# Patient Record
Sex: Female | Born: 2000 | Hispanic: Yes | Marital: Single | Smoking: Never smoker
Health system: Southern US, Community
[De-identification: ages and names within clinical notes are randomized; demographics above are authoritative.]

## PROBLEM LIST (undated history)

## (undated) DIAGNOSIS — N911 Secondary amenorrhea: Secondary | ICD-10-CM

## (undated) DIAGNOSIS — L309 Dermatitis, unspecified: Secondary | ICD-10-CM

## (undated) DIAGNOSIS — T783XXA Angioneurotic edema, initial encounter: Secondary | ICD-10-CM

## (undated) HISTORY — DX: Angioneurotic edema, initial encounter: T78.3XXA

## (undated) HISTORY — DX: Dermatitis, unspecified: L30.9

---
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
## 2021-03-25 ENCOUNTER — Other Ambulatory Visit: Payer: Self-pay

## 2021-03-25 ENCOUNTER — Encounter: Payer: Self-pay | Admitting: Allergy

## 2021-03-25 ENCOUNTER — Ambulatory Visit (INDEPENDENT_AMBULATORY_CARE_PROVIDER_SITE_OTHER): Payer: Medicaid Other | Admitting: Allergy

## 2021-03-25 VITALS — BP 118/76 | HR 82 | Temp 97.6°F | Resp 16 | Ht 62.0 in | Wt 169.6 lb

## 2021-03-25 DIAGNOSIS — T781XXD Other adverse food reactions, not elsewhere classified, subsequent encounter: Secondary | ICD-10-CM

## 2021-03-25 DIAGNOSIS — J302 Other seasonal allergic rhinitis: Secondary | ICD-10-CM | POA: Diagnosis not present

## 2021-03-25 DIAGNOSIS — H1010 Acute atopic conjunctivitis, unspecified eye: Secondary | ICD-10-CM | POA: Diagnosis not present

## 2021-03-25 DIAGNOSIS — J3089 Other allergic rhinitis: Secondary | ICD-10-CM

## 2021-03-25 MED ORDER — FLUTICASONE PROPIONATE 50 MCG/ACT NA SUSP: 1.0000 | Freq: Two times a day (BID) | NASAL | 5 refills | Status: AC | PRN

## 2021-03-25 MED ORDER — CROMOLYN SODIUM 4 % OP SOLN: 1.0000 [drp] | Freq: Four times a day (QID) | OPHTHALMIC | 5 refills | Status: DC | PRN

## 2021-03-25 NOTE — Assessment & Plan Note (Signed)
Past history - Rhinoconjunctivitis symptoms for 10 years mainly in the spring.   She also mentions getting an "allergy injection" every spring which has been helping. 2021 skin testing showed: Positive to grass, weed, ragweed, trees, dust mites, cat.  Interim history - symptoms flared. Not sure about logistics of getting AIT as she goes to school at [employer].   Continue environmental control measures as below.  May use over the counter antihistamines such as Zyrtec (cetirizine), Claritin (loratadine), Allegra (fexofenadine), or Xyzal (levocetirizine) daily as needed.   May use cromolyn 4% 1 drop in each eye up to four times a day as needed for itchy/watery eyes.   May use Flonase (fluticasone) nasal spray 1 spray per nostril twice a day as needed for nasal congestion.   Nasal saline spray (i.e., Simply Saline) or nasal saline lavage (i.e., NeilMed) is recommended as needed and prior to medicated nasal sprays.  Consider allergy injections for long term control if above medications do not help the symptoms - handout given.   You can check with your student health at [facility] if they administer allergy shots for their students.  If you want to start in the summer when are you are in [place] let us know.

## 2021-03-25 NOTE — Patient Instructions (Addendum)
Food:  Continue to avoid the foods that bother you - cherries, apples, kiwi.   START to avoid bananas for now.  For mild symptoms you can take over the counter antihistamines such as Benadryl and monitor symptoms closely. If symptoms worsen or if you have severe symptoms including breathing issues, throat closure, significant swelling, whole body hives, severe diarrhea and vomiting, lightheadedness then seek immediate medical care.  Get bloodwork:  We are ordering labs, so please allow 1-2 weeks for the results to come back. With the newly implemented Cures Act, the labs might be visible to you at the same time that they become visible to me. However, I will not address the results until all of the results are back, so please be patient.  In the meantime, continue recommendations in your patient instructions, including avoidance measures (if applicable), until you hear from me.   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 This is caused by cross reactivity of pollen with fresh fruits and vegetables, and nuts. Symptoms are usually localized in the form of itching and burning in mouth and throat. Very rarely it can progress to more severe symptoms. Eating foods in cooked or processed forms usually minimizes symptoms. I recommended avoidance of eating the problem foods, especially during the peak season(s). Sometimes, OFAS can induce severe throat swelling or even a systemic reaction; with such instance, I advised them to report to a local ER. A list of common pollens and food cross-reactivities was provided to the patient.   Environmental allergies  Continue environmental control measures as below.  May use over the counter antihistamines such as Zyrtec (cetirizine), Claritin (loratadine), Allegra (fexofenadine), or Xyzal (levocetirizine) daily as needed.   May use cromolyn 4% 1 drop in each eye up to four times a day as  needed for itchy/watery eyes.   May use Flonase (fluticasone) nasal spray 1 spray per nostril twice a day as needed for nasal congestion.   Nasal saline spray (i.e., Simply Saline) or nasal saline lavage (i.e., NeilMed) is recommended as needed and prior to medicated nasal sprays.  Consider allergy injections for long term control if above medications do not help the symptoms - handout given.   You can check with your student health at [facility] if they administer allergy shots for their students.  If you want to start in the summer when are you are in [place] let us know.   Follow up in 6 months or sooner if needed.   Reducing Pollen Exposure . Pollen seasons: trees (spring), grass (summer) and ragweed/weeds (fall). Marland Kitchen Keep windows closed in your home and car to lower pollen exposure.  Lilian Kapur air conditioning in the bedroom and throughout the house if possible.  . Avoid going out in dry windy days - especially early morning. . Pollen counts are highest between 5 - 10 AM and on dry, hot and windy days.  . Save outside activities for late afternoon or after a heavy rain, when pollen levels are lower.  . Avoid mowing of grass if you have grass pollen allergy. Marland Kitchen Be aware that pollen can also be transported indoors on people and pets.  . Dry your clothes in an automatic dryer rather than hanging them outside where they might collect pollen.  . Rinse hair and eyes before bedtime. Control of House Dust Mite Allergen . Dust mite allergens are a common trigger of allergy and asthma symptoms. While they can  be found throughout the house, these microscopic creatures thrive in warm, humid environments such as bedding, upholstered furniture and carpeting. . Because so much time is spent in the bedroom, it is essential to reduce mite levels there.  . Encase pillows, mattresses, and box springs in special allergen-proof fabric covers or airtight, zippered plastic covers.  . Bedding should be  washed weekly in hot water (130 F) and dried in a hot dryer. Allergen-proof covers are available for comforters and pillows that can't be regularly washed.  Reyes Ivan the allergy-proof covers every few months. Minimize clutter in the bedroom. Keep pets out of the bedroom.  Marland Kitchen Keep humidity less than 50% by using a dehumidifier or air conditioning. You can buy a humidity measuring device called a hygrometer to monitor this.  . If possible, replace carpets with hardwood, linoleum, or washable area rugs. If that's not possible, vacuum frequently with a vacuum that has a HEPA filter. . Remove all upholstered furniture and non-washable window drapes from the bedroom. . Remove all non-washable stuffed toys from the bedroom.  Wash stuffed toys weekly. Pet Allergen Avoidance: . Contrary to popular opinion, there are no "hypoallergenic" breeds of dogs or cats. That is because people are not allergic to an animal's hair, but to an allergen found in the animal's saliva, dander (dead skin flakes) or urine. Pet allergy symptoms typically occur within minutes. For some people, symptoms can build up and become most severe 8 to 12 hours after contact with the animal. People with severe allergies can experience reactions in public places if dander has been transported on the pet owners' clothing. Marland Kitchen Keeping an animal outdoors is only a partial solution, since homes with pets in the yard still have higher concentrations of animal allergens. . Before getting a pet, ask your allergist to determine if you are allergic to animals. If your pet is already considered part of your family, try to minimize contact and keep the pet out of the bedroom and other rooms where you spend a great deal of time. . As with dust mites, vacuum carpets often or replace carpet with a hardwood floor, tile or linoleum. . High-efficiency particulate air (HEPA) cleaners can reduce allergen levels over time. . While dander and saliva are the source of cat  and dog allergens, urine is the source of allergens from rabbits, hamsters, mice and [place] pigs; so ask a non-allergic family member to clean the animal's cage. . If you have a pet allergy, talk to your allergist about the potential for allergy immunotherapy (allergy shots). This strategy can often provide long-term relief.

## 2021-03-25 NOTE — Progress Notes (Signed)
Follow Up Note  RE: Heather Acosta MRN: [id number] DOB: [birth year] Date of Office Visit: 03/25/2021  Referring provider: Christel Mormon, MD Primary care provider: Christel Mormon, MD  Chief Complaint: Allergy Testing Fernand Parkins - 2 months ago ate a banana - caused itching, swelling and hives  - took benadryl it too a while for swelling to go down )  History of Present Illness: I had the pleasure of seeing Heather Acosta for a follow up visit at the [facility] on 03/25/2021. She is a 20 y.o. female, who is being followed for allergic rhinoconjunctivitis and oral allergy syndrome. Her previous allergy office visit was on 07/03/2020 with Dr. Selena Batten. Today is a regular follow up visit and new complaint of food allergy.  About 2 months ago, patient had itching on her legs and hands. The following day she woke up with feet and hand swelling. She had hives and itchy body.  Patient had a banana the day this started to happen which she usually does not eat. Denies any immediate symptoms.  She took some benadryl in the morning and the symptoms resolved within 1 day.   No prior episodes like this. Denies any fevers, chills, changes in medications, foods, personal care products or recent infections.  Since then avoiding bananas.  Still avoiding cherries, apples, kiwi. No issues with oranges, avocado and kiwi.   Allergic rhino conjunctivitis Currently taking Xyzal daily with some benefit. Gets some itchy eyes and nasal congestion.   Patient attends [employer] and not sure how starting allergy shots would work.  Assessment and Plan: Mena is a 20 y.o. female with: Other adverse food reactions, not elsewhere classified, subsequent encounter Past history - Currently avoiding cherries, apples, oranges, avocado, pineapple and kiwi.  They mainly cause perioral pruritus and lip swelling.  Symptoms resolve within 1 hour without any medications.  Sometimes able to  tolerate oranges, avocados with no issues.  2021 skin testing was negative to select foods. Interim history - Concerned about banana allergy as she broke out in itching/rash and swelling of the feet and hands after eating bananas. No immediate symptoms noted. resolved within 1 day after taking benadryl. Denies any other changes in diet, meds or personal care products. previously tolerated without any issues. Eating avocado, oranges and pineapples.   Continue to avoid the foods that bother you - cherries, apples, kiwi.   START to avoid bananas for now.  For mild symptoms you can take over the counter antihistamines such as Benadryl and monitor symptoms closely. If symptoms worsen or if you have severe symptoms including breathing issues, throat closure, significant swelling, whole body hives, severe diarrhea and vomiting, lightheadedness then seek immediate medical care.  Unable to skin test today due to recent antihistamine intake. Get bloodwork for banana IgE.  Oral allergy syndrome, subsequent encounter  Discussed that her food triggered oral and throat symptoms are likely caused by oral food allergy syndrome (OFAS). This is caused by cross reactivity of pollen with fresh fruits and vegetables, and nuts. Symptoms are usually localized in the form of itching and burning in mouth and throat. Very rarely it can progress to more severe symptoms. Eating foods in cooked or processed forms usually minimizes symptoms. I recommended avoidance of eating the problem foods, especially during the peak season(s). Sometimes, OFAS can induce severe throat swelling or even a systemic reaction; with such instance, I advised them to report to a local ER. A list of common pollens and food  cross-reactivities was provided to the patient.   Seasonal and perennial allergic rhinoconjunctivitis Past history - Rhinoconjunctivitis symptoms for 10 years mainly in the spring.   She also mentions getting an "allergy injection"  every spring which has been helping. 2021 skin testing showed: Positive to grass, weed, ragweed, trees, dust mites, cat.  Interim history - symptoms flared. Not sure about logistics of getting AIT as she goes to school at [employer].   Continue environmental control measures as below.  May use over the counter antihistamines such as Zyrtec (cetirizine), Claritin (loratadine), Allegra (fexofenadine), or Xyzal (levocetirizine) daily as needed.   May use cromolyn 4% 1 drop in each eye up to four times a day as needed for itchy/watery eyes.   May use Flonase (fluticasone) nasal spray 1 spray per nostril twice a day as needed for nasal congestion.   Nasal saline spray (i.e., Simply Saline) or nasal saline lavage (i.e., NeilMed) is recommended as needed and prior to medicated nasal sprays.  Consider allergy injections for long term control if above medications do not help the symptoms - handout given.   You can check with your student health at [facility] if they administer allergy shots for their students.  If you want to start in the summer when are you are in [place] let us know.   Return in about 6 months (around 09/25/2021).  Meds ordered this encounter  Medications  . fluticasone (FLONASE) 50 MCG/ACT nasal spray    Sig: Place 1 spray into both nostrils 2 (two) times daily as needed for rhinitis.    Dispense:  16 g    Refill:  5  . cromolyn (OPTICROM) 4 % ophthalmic solution    Sig: Place 1 drop into both eyes 4 (four) times daily as needed (itchy/watery eyes).    Dispense:  10 mL    Refill:  5    Lab Orders     Allergen, Banana f92  Diagnostics: Skin Testing: Deferred due to recent antihistamines use.  Medication List:  Current Outpatient Medications  Medication Sig Dispense Refill  . cromolyn (OPTICROM) 4 % ophthalmic solution Place 1 drop into both eyes 4 (four) times daily as needed (itchy/watery eyes). 10 mL 5  . fluticasone (FLONASE) 50 MCG/ACT nasal spray  Place 1 spray into both nostrils 2 (two) times daily as needed for rhinitis. 16 g 5  . levocetirizine (XYZAL) 5 MG tablet Take 5 mg by mouth every evening.     No current facility-administered medications for this visit.   Allergies: No Known Allergies I reviewed her past medical history, social history, family history, and environmental history and no significant changes have been reported from her previous visit.  Review of Systems  Constitutional: Negative for appetite change, chills, fever and unexpected weight change.  HENT: Positive for congestion. Negative for rhinorrhea.   Eyes: Positive for itching.  Respiratory: Negative for cough, chest tightness, shortness of breath and wheezing.   Cardiovascular: Negative for chest pain.  Gastrointestinal: Negative for abdominal pain.  Genitourinary: Negative for difficulty urinating.  Skin: Negative for rash.  Allergic/Immunologic: Positive for environmental allergies.  Neurological: Negative for headaches.   Objective: BP 118/76   Pulse 82   Temp 97.6 F (36.4 C)   Resp 16   Ht 5\' 2"  (1.575 m)   Wt 169 lb 9.6 oz (76.9 kg)   SpO2 98%   BMI 31.02 kg/m  Body mass index is 31.02 kg/m. Physical Exam Vitals and nursing note reviewed.  Constitutional:  Appearance: Normal appearance. She is well-developed.  HENT:     Head: Normocephalic and atraumatic.     Right Ear: Tympanic membrane and external ear normal.     Left Ear: Tympanic membrane and external ear normal.     Nose: Nose normal.     Mouth/Throat:     Mouth: Mucous membranes are moist.     Pharynx: Oropharynx is clear.  Eyes:     Conjunctiva/sclera: Conjunctivae normal.  Cardiovascular:     Rate and Rhythm: Normal rate and regular rhythm.     Heart sounds: Normal heart sounds. No murmur heard. No friction rub. No gallop.   Pulmonary:     Effort: Pulmonary effort is normal.     Breath sounds: Normal breath sounds. No wheezing, rhonchi or rales.   Musculoskeletal:     Cervical back: Neck supple.  Skin:    General: Skin is warm.     Findings: No rash.  Neurological:     Mental Status: She is alert and oriented to person, place, and time.  Psychiatric:        Behavior: Behavior normal.    Previous notes and tests were reviewed. The plan was reviewed with the patient/family, and all questions/concerned were addressed.  It was my pleasure to see Nioma today and participate in her care. Please feel free to contact me with any questions or concerns.  Sincerely,  Wyline Mood, DO Allergy & Immunology  [facility] office: [phone] [facility] office: [phone]

## 2021-03-25 NOTE — Assessment & Plan Note (Signed)
Past history - Currently avoiding cherries, apples, oranges, avocado, pineapple and kiwi.  They mainly cause perioral pruritus and lip swelling.  Symptoms resolve within 1 hour without any medications.  Sometimes able to tolerate oranges, avocados with no issues.  2021 skin testing was negative to select foods. Interim history - Concerned about banana allergy as she broke out in itching/rash and swelling of the feet and hands after eating bananas. No immediate symptoms noted. resolved within 1 day after taking benadryl. Denies any other changes in diet, meds or personal care products. previously tolerated without any issues. Eating avocado, oranges and pineapples.   Continue to avoid the foods that bother you - cherries, apples, kiwi.   START to avoid bananas for now.  For mild symptoms you can take over the counter antihistamines such as Benadryl and monitor symptoms closely. If symptoms worsen or if you have severe symptoms including breathing issues, throat closure, significant swelling, whole body hives, severe diarrhea and vomiting, lightheadedness then seek immediate medical care.  Unable to skin test today due to recent antihistamine intake. Get bloodwork for banana IgE.

## 2021-03-25 NOTE — Assessment & Plan Note (Signed)
   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 This is caused by cross reactivity of pollen with fresh fruits and vegetables, and nuts. Symptoms are usually localized in the form of itching and burning in mouth and throat. Very rarely it can progress to more severe symptoms. Eating foods in cooked or processed forms usually minimizes symptoms. I recommended avoidance of eating the problem foods, especially during the peak season(s). Sometimes, OFAS can induce severe throat swelling or even a systemic reaction; with such instance, I advised them to report to a local ER. A list of common pollens and food cross-reactivities was provided to the patient.

## 2021-03-31 LAB — ALLERGEN BANANA: Allergen Banana IgE: 0.4 kU/L — AB

## 2021-09-25 NOTE — Progress Notes (Signed)
Follow Up Note  RE: Heather Acosta MRN: [id number] DOB: 24-Apr-2001 Date of Office Visit: 09/26/2021  Referring provider: Christel Mormon, MD Primary care provider: Christel Mormon, MD  Chief Complaint: Allergic Rhinitis  (Recently had an outbreak and it has not happened in a long time thinks its related to her allergies - stopped taking allergy meds in June of 2022), Eczema (Arms, all of her neck, eye lids, around her mouths - uses utterly smooth (cow lotion) has helped calm it ), and Angioedema (No flares )  History of Present Illness: I had the pleasure of seeing Heather Acosta for a follow up visit at the [facility] on 09/26/2021. She is a 20 y.o. female, who is being followed for food allergy, oral allergy syndrome, allergic rhinoconjunctivitis. Her previous allergy office visit was on 03/25/2021 with Dr. Selena Batten. Today is a regular follow up visit.  Skin: Patient has been breaking out in July which progressively has gotten worse. This usually flares on the arms, neck, eyes, lips. Currently using some type of OTC lotion.  Not topical steroid creams.  Denies any changes in personal care products when this started to happen.  She used to have eczema 7 years ago but it was well controlled until recently.   Food:  Avoiding cherries, apples, kiwi, banana. No reactions since the last visit.   Seasonal and perennial allergic rhinoconjunctivitis Currently not taking any daily antihistamines.  In the spring patient takes antihistamines with good benefit.   Interested in starting AIT if her student health will give the injections.    Component     Latest Ref Rng & Units 03/25/2021  Allergen Banana IgE     Class I kU/L 0.40 (A)    Assessment and Plan: Heather Acosta is a 20 y.o. female with: Other atopic dermatitis Flaring since July on the arms, neck and eyes.  Denies changes in personal care products.  No recent topical steroid cream use. See below for  proper skin care.  Use triamcinolone 0.1% ointment twice a day as needed for rash flares. Do not use on the face, neck, armpits or groin area. Do not use more than 3 weeks in a row.  Use Eucrisa (crisaborole) 2% ointment twice a day on mild rash flares on the face and body. This is a non-steroid ointment. Samples given.   Seasonal and perennial allergic rhinoconjunctivitis Past history - Rhinoconjunctivitis symptoms for 10 years mainly in the spring.   She also mentions getting an "allergy injection" every spring which has been helping. 2021 skin testing showed: Positive to grass, weed, ragweed, trees, dust mites, cat.  Interim history - interested in starting AIT. Currently asymptomatic with no meds. Continue environmental control measures as below. Use over the counter antihistamines such as Zyrtec (cetirizine), Claritin (loratadine), Allegra (fexofenadine), or Xyzal (levocetirizine) daily as needed. May take twice a day during allergy flares. May switch antihistamines every few months. May use cromolyn 4% 1 drop in each eye up to four times a day as needed for itchy/watery eyes.  May use Flonase (fluticasone) nasal spray 1 spray per nostril twice a day as needed for nasal congestion.  Nasal saline spray (i.e., Simply Saline) or nasal saline lavage (i.e., NeilMed) is recommended as needed and prior to medicated nasal sprays. Consider allergy injections for long term control if above medications do not help the symptoms - handout given.  You can check with your student health at [facility] if they administer allergy shots for  their students.  Other adverse food reactions, not elsewhere classified, subsequent encounter Past history - Currently avoiding cherries, apples, oranges, avocado, pineapple and kiwi.  They mainly cause perioral pruritus and lip swelling.  Symptoms resolve within 1 hour without any medications.  Sometimes able to tolerate oranges, avocados with no issues.  2021 skin testing  was negative to select foods. Edema of hands and feet after eating banana. Interim history - positive banana IgE 0.4. Continue to avoid the foods that bother you - cherries, apples, kiwi, banana.  For mild symptoms you can take over the counter antihistamines such as Benadryl and monitor symptoms closely. If symptoms worsen or if you have severe symptoms including breathing issues, throat closure, significant swelling, whole body hives, severe diarrhea and vomiting, lightheadedness then seek immediate medical care.  Return in about 6 months (around 03/26/2022).  Meds ordered this encounter  Medications   triamcinolone ointment (KENALOG) 0.1 %    Sig: Apply 1 application topically 2 (two) times daily as needed (rash flare). Do not use on the face, neck, armpits or groin area. Do not use more than 3 weeks in a row.    Dispense:  80 g    Refill:  2   Crisaborole (EUCRISA) 2 % OINT    Sig: Apply 1 application topically 2 (two) times daily as needed (mild rash). Okay to use on face.    Dispense:  60 g    Refill:  5    Lab Orders  No laboratory test(s) ordered today    Diagnostics: None.  Medication List:  Current Outpatient Medications  Medication Sig Dispense Refill   Crisaborole (EUCRISA) 2 % OINT Apply 1 application topically 2 (two) times daily as needed (mild rash). Okay to use on face. 60 g 5   cromolyn (OPTICROM) 4 % ophthalmic solution Place 1 drop into both eyes 4 (four) times daily as needed (itchy/watery eyes). 10 mL 5   fluticasone (FLONASE) 50 MCG/ACT nasal spray Place 1 spray into both nostrils 2 (two) times daily as needed for rhinitis. 16 g 5   triamcinolone ointment (KENALOG) 0.1 % Apply 1 application topically 2 (two) times daily as needed (rash flare). Do not use on the face, neck, armpits or groin area. Do not use more than 3 weeks in a row. 80 g 2   levocetirizine (XYZAL) 5 MG tablet Take 5 mg by mouth every evening. (Patient not taking: Reported on 09/26/2021)     No  current facility-administered medications for this visit.   Allergies: No Known Allergies I reviewed her past medical history, social history, family history, and environmental history and no significant changes have been reported from her previous visit.  Review of Systems  Constitutional:  Negative for appetite change, chills, fever and unexpected weight change.  HENT:  Negative for congestion and rhinorrhea.   Eyes:  Negative for itching.  Respiratory:  Negative for cough, chest tightness, shortness of breath and wheezing.   Cardiovascular:  Negative for chest pain.  Gastrointestinal:  Negative for abdominal pain.  Genitourinary:  Negative for difficulty urinating.  Skin:  Positive for rash.  Allergic/Immunologic: Positive for environmental allergies.  Neurological:  Negative for headaches.   Objective: BP 120/78   Pulse (!) 102   Temp 98 F (36.7 C)   Resp 20   Ht 5\' 4"  (1.626 m)   Wt 167 lb 1.9 oz (75.8 kg)   SpO2 99%   BMI 28.69 kg/m  Body mass index is 28.69 kg/m. Physical Exam  Vitals and nursing note reviewed.  Constitutional:      Appearance: Normal appearance. She is well-developed.  HENT:     Head: Normocephalic and atraumatic.     Right Ear: Tympanic membrane and external ear normal.     Left Ear: Tympanic membrane and external ear normal.     Nose: Nose normal.     Mouth/Throat:     Mouth: Mucous membranes are moist.     Pharynx: Oropharynx is clear.  Eyes:     Conjunctiva/sclera: Conjunctivae normal.  Cardiovascular:     Rate and Rhythm: Normal rate and regular rhythm.     Heart sounds: Normal heart sounds. No murmur heard.   No friction rub. No gallop.  Pulmonary:     Effort: Pulmonary effort is normal.     Breath sounds: Normal breath sounds. No wheezing, rhonchi or rales.  Musculoskeletal:     Cervical back: Neck supple.  Skin:    General: Skin is warm and dry.     Findings: Rash present.     Comments: Dry, hypopigmented patch on left  antecubital fossa.  Similar but erythematous patch on the right antecubital fossa.  Dry patches periorbitally bilaterally. Erythematous patch on the neck.  Neurological:     Mental Status: She is alert and oriented to person, place, and time.  Psychiatric:        Behavior: Behavior normal.   Previous notes and tests were reviewed. The plan was reviewed with the patient/family, and all questions/concerned were addressed.  It was my pleasure to see Heather Acosta today and participate in her care. Please feel free to contact me with any questions or concerns.  Sincerely,  Wyline Mood, DO Allergy & Immunology  [facility] office: [phone] [facility] office: [phone]

## 2021-09-26 ENCOUNTER — Ambulatory Visit (INDEPENDENT_AMBULATORY_CARE_PROVIDER_SITE_OTHER): Payer: Medicaid Other | Admitting: Allergy

## 2021-09-26 ENCOUNTER — Encounter: Payer: Self-pay | Admitting: Allergy

## 2021-09-26 ENCOUNTER — Other Ambulatory Visit: Payer: Self-pay

## 2021-09-26 VITALS — BP 120/78 | HR 102 | Temp 98.0°F | Resp 20 | Ht 64.0 in | Wt 167.1 lb

## 2021-09-26 DIAGNOSIS — L2089 Other atopic dermatitis: Secondary | ICD-10-CM

## 2021-09-26 DIAGNOSIS — H1013 Acute atopic conjunctivitis, bilateral: Secondary | ICD-10-CM | POA: Diagnosis not present

## 2021-09-26 DIAGNOSIS — J302 Other seasonal allergic rhinitis: Secondary | ICD-10-CM | POA: Diagnosis not present

## 2021-09-26 DIAGNOSIS — T781XXD Other adverse food reactions, not elsewhere classified, subsequent encounter: Secondary | ICD-10-CM | POA: Diagnosis not present

## 2021-09-26 DIAGNOSIS — J3089 Other allergic rhinitis: Secondary | ICD-10-CM

## 2021-09-26 DIAGNOSIS — H1010 Acute atopic conjunctivitis, unspecified eye: Secondary | ICD-10-CM

## 2021-09-26 MED ORDER — TRIAMCINOLONE ACETONIDE 0.1 % EX OINT: 1.0000 "application " | TOPICAL_OINTMENT | Freq: Two times a day (BID) | CUTANEOUS | 2 refills | Status: AC | PRN

## 2021-09-26 MED ORDER — EUCRISA 2 % EX OINT: 1.0000 "application " | TOPICAL_OINTMENT | Freq: Two times a day (BID) | CUTANEOUS | 5 refills | Status: AC | PRN

## 2021-09-26 NOTE — Assessment & Plan Note (Signed)
Flaring since July on the arms, neck and eyes.  Denies changes in personal care products.  No recent topical steroid cream use. . See below for proper skin care.  . Use triamcinolone 0.1% ointment twice a day as needed for rash flares. Do not use on the face, neck, armpits or groin area. Do not use more than 3 weeks in a row.   Use Eucrisa (crisaborole) 2% ointment twice a day on mild rash flares on the face and body. This is a non-steroid ointment. Samples given.

## 2021-09-26 NOTE — Assessment & Plan Note (Addendum)
Past history - Currently avoiding cherries, apples, oranges, avocado, pineapple and kiwi.  They mainly cause perioral pruritus and lip swelling.  Symptoms resolve within 1 hour without any medications.  Sometimes able to tolerate oranges, avocados with no issues.  2021 skin testing was negative to select foods. Edema of hands and feet after eating banana. Interim history - positive banana IgE 0.4.  Continue to avoid the foods that bother you - cherries, apples, kiwi, banana.   For mild symptoms you can take over the counter antihistamines such as Benadryl and monitor symptoms closely. If symptoms worsen or if you have severe symptoms including breathing issues, throat closure, significant swelling, whole body hives, severe diarrhea and vomiting, lightheadedness then seek immediate medical care.

## 2021-09-26 NOTE — Assessment & Plan Note (Signed)
Past history - Rhinoconjunctivitis symptoms for 10 years mainly in the spring.   She also mentions getting an "allergy injection" every spring which has been helping. 2021 skin testing showed: Positive to grass, weed, ragweed, trees, dust mites, cat.  Interim history - interested in starting AIT. Currently asymptomatic with no meds.  Continue environmental control measures as below. . Use over the counter antihistamines such as Zyrtec (cetirizine), Claritin (loratadine), Allegra (fexofenadine), or Xyzal (levocetirizine) daily as needed. May take twice a day during allergy flares. May switch antihistamines every few months.  May use cromolyn 4% 1 drop in each eye up to four times a day as needed for itchy/watery eyes.   May use Flonase (fluticasone) nasal spray 1 spray per nostril twice a day as needed for nasal congestion.   Nasal saline spray (i.e., Simply Saline) or nasal saline lavage (i.e., NeilMed) is recommended as needed and prior to medicated nasal sprays.  Consider allergy injections for long term control if above medications do not help the symptoms - handout given.   You can check with your student health at [facility] if they administer allergy shots for their students.

## 2021-09-26 NOTE — Patient Instructions (Addendum)
Skin: See below for proper skin care.  Use triamcinolone 0.1% ointment twice a day as needed for rash flares. Do not use on the face, neck, armpits or groin area. Do not use more than 3 weeks in a row.  Use Eucrisa (crisaborole) 2% ointment twice a day on mild rash flares on the face and body. This is a non-steroid ointment. Samples given.  If it burns, place the medication in the refrigerator.  Apply a thin layer of moisturizer and then apply the Eucrisa on top of it.  Food: Continue to avoid the foods that bother you - cherries, apples, kiwi, banana.  For mild symptoms you can take over the counter antihistamines such as Benadryl and monitor symptoms closely. If symptoms worsen or if you have severe symptoms including breathing issues, throat closure, significant swelling, whole body hives, severe diarrhea and vomiting, lightheadedness then seek immediate medical care.  Environmental allergies Continue environmental control measures as below. Use over the counter antihistamines such as Zyrtec (cetirizine), Claritin (loratadine), Allegra (fexofenadine), or Xyzal (levocetirizine) daily as needed. May take twice a day during allergy flares. May switch antihistamines every few months.  May use cromolyn 4% 1 drop in each eye up to four times a day as needed for itchy/watery eyes.  May use Flonase (fluticasone) nasal spray 1 spray per nostril twice a day as needed for nasal congestion.  Nasal saline spray (i.e., Simply Saline) or nasal saline lavage (i.e., NeilMed) is recommended as needed and prior to medicated nasal sprays. Consider allergy injections for long term control if above medications do not help the symptoms - handout given.  You can check with your student health at [facility] if they administer allergy shots for their students.  Follow up in 6 months or sooner if needed.   Skin care recommendations  Bath time: Always use lukewarm water. AVOID very hot or cold water. Keep  bathing time to 5-10 minutes. Do NOT use bubble bath. Use a mild soap and use just enough to wash the dirty areas. Do NOT scrub skin vigorously.  After bathing, pat dry your skin with a towel. Do NOT rub or scrub the skin.  Moisturizers and prescriptions:  ALWAYS apply moisturizers immediately after bathing (within 3 minutes). This helps to lock-in moisture. Use the moisturizer several times a day over the whole body. Good summer moisturizers include: Aveeno, CeraVe, Cetaphil. Good winter moisturizers include: Aquaphor, Vaseline, Cerave, Cetaphil, Eucerin, Vanicream. When using moisturizers along with medications, the moisturizer should be applied about one hour after applying the medication to prevent diluting effect of the medication or moisturize around where you applied the medications. When not using medications, the moisturizer can be continued twice daily as maintenance.  Laundry and clothing: Avoid laundry products with added color or perfumes. Use unscented hypo-allergenic laundry products such as Tide free, Cheer free & gentle, and All free and clear.  If the skin still seems dry or sensitive, you can try double-rinsing the clothes. Avoid tight or scratchy clothing such as wool. Do not use fabric softeners or dyer sheets.

## 2022-01-25 ENCOUNTER — Other Ambulatory Visit: Payer: Self-pay | Admitting: Allergy
